# Patient Record
Sex: Male | Born: 1990 | Race: White | Hispanic: No | Marital: Single | Smoking: Former smoker
Health system: Southern US, Community
[De-identification: ages and names within clinical notes are randomized; demographics above are authoritative.]

## PROBLEM LIST (undated history)

## (undated) DIAGNOSIS — J45909 Unspecified asthma, uncomplicated: Secondary | ICD-10-CM

## (undated) DIAGNOSIS — R002 Palpitations: Secondary | ICD-10-CM

## (undated) DIAGNOSIS — J302 Other seasonal allergic rhinitis: Secondary | ICD-10-CM

---

## 2014-05-30 ENCOUNTER — Emergency Department (HOSPITAL_COMMUNITY)
Admission: EM | Admit: 2014-05-30 | Discharge: 2014-05-30 | Disposition: A | Discharge status: Home / Self Care | Attending: Emergency Medicine | Admitting: Emergency Medicine

## 2014-05-30 ENCOUNTER — Encounter (HOSPITAL_COMMUNITY): Payer: Self-pay | Admitting: Emergency Medicine

## 2014-05-30 ENCOUNTER — Emergency Department (HOSPITAL_COMMUNITY)

## 2014-05-30 DIAGNOSIS — W172XXA Fall into hole, initial encounter: Secondary | ICD-10-CM | POA: Insufficient documentation

## 2014-05-30 DIAGNOSIS — Y9389 Activity, other specified: Secondary | ICD-10-CM | POA: Diagnosis not present

## 2014-05-30 DIAGNOSIS — Y929 Unspecified place or not applicable: Secondary | ICD-10-CM | POA: Diagnosis not present

## 2014-05-30 DIAGNOSIS — J45909 Unspecified asthma, uncomplicated: Secondary | ICD-10-CM | POA: Insufficient documentation

## 2014-05-30 DIAGNOSIS — Z791 Long term (current) use of non-steroidal anti-inflammatories (NSAID): Secondary | ICD-10-CM | POA: Insufficient documentation

## 2014-05-30 DIAGNOSIS — Z87891 Personal history of nicotine dependence: Secondary | ICD-10-CM | POA: Insufficient documentation

## 2014-05-30 DIAGNOSIS — W010XXA Fall on same level from slipping, tripping and stumbling without subsequent striking against object, initial encounter: Secondary | ICD-10-CM | POA: Diagnosis not present

## 2014-05-30 DIAGNOSIS — S62339A Displaced fracture of neck of unspecified metacarpal bone, initial encounter for closed fracture: Secondary | ICD-10-CM | POA: Diagnosis not present

## 2014-05-30 DIAGNOSIS — S6990XA Unspecified injury of unspecified wrist, hand and finger(s), initial encounter: Secondary | ICD-10-CM | POA: Diagnosis present

## 2014-05-30 HISTORY — DX: Unspecified asthma, uncomplicated: J45.909

## 2014-05-30 HISTORY — DX: Palpitations: R00.2

## 2014-05-30 HISTORY — DX: Other seasonal allergic rhinitis: J30.2

## 2014-05-30 MED ORDER — IBUPROFEN 800 MG PO TABS
800.0000 mg | ORAL_TABLET | Freq: Once | ORAL | Status: AC
  Administered 2014-05-30: 800 mg via ORAL
  Filled 2014-05-30: qty 1

## 2014-05-30 MED ORDER — IBUPROFEN 800 MG PO TABS: 800.0000 mg | ORAL_TABLET | Freq: Three times a day (TID) | ORAL | Status: AC

## 2014-05-30 NOTE — Discharge Instructions (Signed)
Boxer's Fracture °You have a break (fracture) of the fifth metacarpal bone. This is commonly called a boxer's fracture. This is the bone in the hand where the little finger attaches. The fracture is in the end of that bone, closest to the little finger. It is usually caused when you hit an object with a clenched fist. Often, the knuckle is pushed down by the impact. Sometimes, the fracture rotates out of position. A boxer's fracture will usually heal within 6 weeks, if it is treated properly and protected from re-injury. Surgery is sometimes needed. °A cast, splint, or bulky hand dressing may be used to protect and immobilize a boxer's fracture. Do not remove this device or dressing until your caregiver approves. Keep your hand elevated, and apply ice packs for 15-20 minutes every 2 hours, for the first 2 days. Elevation and ice help reduce swelling and relieve pain. See your caregiver, or an orthopedic specialist, for follow-up care within the next 10 days. This is to make sure your fracture is healing properly. °Document Released: 10/22/2005 Document Revised: 01/14/2012 Document Reviewed: 04/11/2007 °Exit[employer]® Patient Information ©2015 [employer]. This information is not intended to replace advice given to you by your health care provider. Make sure you discuss any questions you have with your health care provider. ° °

## 2014-05-30 NOTE — ED Notes (Signed)
Misstepped into hole and fell on fisted R hand.  Pain in last two knuckles and lateral hand.

## 2014-06-02 NOTE — ED Provider Notes (Signed)
CSN: [id number]     Arrival date & time 05/30/14  1946 History   First MD Initiated Contact with Patient 05/30/14 2040     Chief Complaint  Patient presents with  . Hand Injury   Nathan Krueger is a 23 y.o. male who is an inmate at local correctional facility, presents to the Emergency Department complaining of pain and swelling to his right hand.    (Consider location/radiation/quality/duration/timing/severity/associated sxs/prior Treatment) Patient is a 23 y.o. male presenting with hand injury. The history is provided by the patient.  Hand Injury Location:  Hand Injury: yes   Mechanism of injury: fall   Fall:    Fall occurred: tripped , fell onto his fisted hand.   Point of impact:  Hands   Entrapped after fall: no   Hand location:  R hand Pain details:    Quality:  Throbbing   Radiates to:  Does not radiate   Severity:  Moderate   Onset quality:  Sudden   Timing:  Constant   Progression:  Unchanged Chronicity:  New Handedness:  Right-handed Dislocation: no   Prior injury to area:  No Relieved by:  Nothing Worsened by:  Movement Ineffective treatments:  None tried Associated symptoms: swelling   Associated symptoms: no back pain, no decreased range of motion, no fever, no neck pain, no numbness and no stiffness     Past Medical History  Diagnosis Date  . Seasonal allergies   . Asthma   . Palpitation    History reviewed. No pertinent past surgical history. History reviewed. No pertinent family history. History  Substance Use Topics  . Smoking status: Former Games developermoker  . Smokeless tobacco: Not on file  . Alcohol Use: No    Review of Systems  Constitutional: Negative for fever and chills.  Genitourinary: Negative for dysuria and difficulty urinating.  Musculoskeletal: Positive for arthralgias and joint swelling. Negative for back pain, neck pain and stiffness.  Skin: Negative for color change and wound.  All other systems reviewed and are  negative.     Allergies  Review of patient's allergies indicates no known allergies.  Home Medications   Prior to Admission medications   Medication Sig Start Date End Date Taking? Authorizing Provider  ibuprofen (ADVIL,MOTRIN) 800 MG tablet Take 1 tablet (800 mg total) by mouth 3 (three) times daily. 05/30/14   Rein Popov L. Analaura Messler, PA-C   BP 135/66  Pulse 62  Temp(Src) 98.9 F (37.2 C) (Oral)  Resp 16  Ht 5\' 9"  (1.753 m)  Wt 167 lb (75.751 kg)  BMI 24.65 kg/m2  SpO2 100% Physical Exam  Nursing note and vitals reviewed. Constitutional: He is oriented to person, place, and time. He appears well-developed and well-nourished. No distress.  HENT:  Head: Normocephalic and atraumatic.  Cardiovascular: Normal rate, regular rhythm and normal heart sounds.   No murmur heard. Pulmonary/Chest: Effort normal and breath sounds normal. No respiratory distress.  Musculoskeletal: He exhibits edema and tenderness.  ttp and edema of the dorsal right hand.  Radial pulse is brisk, distal sensation intact.  CR< 2 sec.  No bruising or bony deformity.  Pain with movement of the right fifth finger  Neurological: He is alert and oriented to person, place, and time. He exhibits normal muscle tone. Coordination normal.  Skin: Skin is warm and dry.    ED Course  Procedures (including critical care time) Labs Review Labs Reviewed - No data to display  Imaging Review Dg Hand Complete Right  05/30/2014   CLINICAL  DATA:  Injured right hand.  EXAM: RIGHT HAND - COMPLETE 3+ VIEW  COMPARISON:  None.  FINDINGS: There is a boxer's type fracture involving the fifth metacarpal neck with volar angulation. The joint spaces are maintained. No other fractures are seen.  IMPRESSION: Fifth metacarpal neck fracture.   Electronically Signed   By: Loralie Champagne M.D.   On: 05/30/2014 20:51    EKG Interpretation None      MDM   Final diagnoses:  Boxer's metacarpal fracture, neck, closed, initial encounter     Ulnar gutter splint applied, pain improved, remains NV intact.  Pt to f/u with orthopedics affilated with the correctional facility .      Yarlin Breisch L. Trisha Mangle, PA-C 06/02/14 1548

## 2014-06-05 ENCOUNTER — Other Ambulatory Visit: Payer: Self-pay

## 2014-06-05 ENCOUNTER — Encounter (HOSPITAL_COMMUNITY): Payer: Self-pay | Admitting: Emergency Medicine

## 2014-06-05 ENCOUNTER — Emergency Department (HOSPITAL_COMMUNITY)

## 2014-06-05 ENCOUNTER — Emergency Department (HOSPITAL_COMMUNITY)
Admission: EM | Admit: 2014-06-05 | Discharge: 2014-06-05 | Disposition: A | Discharge status: Home / Self Care | Attending: Emergency Medicine | Admitting: Emergency Medicine

## 2014-06-05 DIAGNOSIS — R079 Chest pain, unspecified: Secondary | ICD-10-CM | POA: Insufficient documentation

## 2014-06-05 DIAGNOSIS — R110 Nausea: Secondary | ICD-10-CM | POA: Insufficient documentation

## 2014-06-05 DIAGNOSIS — Z791 Long term (current) use of non-steroidal anti-inflammatories (NSAID): Secondary | ICD-10-CM | POA: Insufficient documentation

## 2014-06-05 DIAGNOSIS — Z87891 Personal history of nicotine dependence: Secondary | ICD-10-CM | POA: Insufficient documentation

## 2014-06-05 DIAGNOSIS — J45909 Unspecified asthma, uncomplicated: Secondary | ICD-10-CM | POA: Insufficient documentation

## 2014-06-05 LAB — D-DIMER, QUANTITATIVE: D-Dimer, Quant: <0.27 ug/mL-FEU (ref 0.00–0.48)

## 2014-06-05 LAB — TROPONIN I: Troponin I: <0.3 ng/mL (ref <0.30)

## 2014-06-05 NOTE — ED Provider Notes (Signed)
CSN: [id number]     Arrival date & time 06/05/14  1955 History  This chart was scribed for Hilario Quarry, MD by Leona Carry, ED Scribe. The patient was seen in [facility]. The patient's care was started at 8:14 PM.     Chief Complaint  Patient presents with  . Chest Pain    intermittent x 6 months denies and SHOB with it    Patient is a 23 y.o. male presenting with chest pain. The history is provided by the patient. No language interpreter was used.  Chest Pain Associated symptoms: nausea   Associated symptoms: no cough and no fever    HPI Comments: Nathan Krueger is a 23 y.o. male with a history of asthma who presents to the Emergency Department complaining of sharp CP. He reports that the pain began while lying on his bed. He also states that it felt as though his heart skipped a beat. He states that the symptoms are worse when he lies flat. Patient reports associated nausea.  He denies fever, chills, cough. Patient reports that his mother has a history of palpitations.   Patient is currently in prison in the 10th month of a 17 month sentence. He does not have a PCP.  Past Medical History  Diagnosis Date  . Seasonal allergies   . Asthma   . Palpitation    History reviewed. No pertinent past surgical history. No family history on file. History  Substance Use Topics  . Smoking status: Former Games developer  . Smokeless tobacco: Not on file  . Alcohol Use: No    Review of Systems  Constitutional: Negative for fever and chills.  Respiratory: Negative for cough.   Cardiovascular: Positive for chest pain.  Gastrointestinal: Positive for nausea.  All other systems reviewed and are negative.     Allergies  Review of patient's allergies indicates no known allergies.  Home Medications   Prior to Admission medications   Medication Sig Start Date End Date Taking? Authorizing Provider  ibuprofen (ADVIL,MOTRIN) 800 MG tablet Take 1 tablet (800 mg total) by mouth 3 (three) times  daily. 05/30/14   Tammy L. Triplett, PA-C   Triage Vitals: BP 133/81  Pulse 85  Temp(Src) 98.8 F (37.1 C) (Oral)  Resp 18  Ht 5\' 9"  (1.753 m)  Wt 167 lb (75.751 kg)  BMI 24.65 kg/m2  SpO2 100% Physical Exam  Nursing note and vitals reviewed. Constitutional: He is oriented to person, place, and time. He appears well-developed and well-nourished.  HENT:  Head: Normocephalic and atraumatic.  Right Ear: External ear normal.  Left Ear: External ear normal.  Nose: Nose normal.  Mouth/Throat: Oropharynx is clear and moist.  Eyes: Conjunctivae and EOM are normal. Pupils are equal, round, and reactive to light.  Neck: Normal range of motion. Neck supple.  Cardiovascular: Normal rate, regular rhythm, normal heart sounds and intact distal pulses.   Pulmonary/Chest: Effort normal and breath sounds normal. No respiratory distress. He has no wheezes. He exhibits no tenderness.  Abdominal: Soft. Bowel sounds are normal. He exhibits no distension and no mass. There is no tenderness. There is no guarding.  Musculoskeletal: Normal range of motion.  Neurological: He is alert and oriented to person, place, and time. He has normal reflexes. He exhibits normal muscle tone. Coordination normal.  Skin: Skin is warm and dry.  Psychiatric: He has a normal mood and affect. His behavior is normal. Judgment and thought content normal.    ED Course  Procedures (including critical  care time) DIAGNOSTIC STUDIES: Oxygen Saturation is 100% on room air, normal by my interpretation.    COORDINATION OF CARE: 8:19 PM-Discussed treatment plan which includes EKG, CXR, and labs with pt at bedside and pt agreed to plan.     Labs Review Labs Reviewed  D-DIMER, QUANTITATIVE  TROPONIN I    Imaging Review Dg Chest 2 View  06/05/2014   CLINICAL DATA:  Chest pain and shortness of breath  EXAM: CHEST  2 VIEW  COMPARISON:  None.  FINDINGS: The heart size and mediastinal contours are within normal limits. Both lungs are  clear. The visualized skeletal structures are unremarkable.  IMPRESSION: No active cardiopulmonary disease.   Electronically Signed   By: Alcide CleverMark  Lukens M.D.   On: 06/05/2014 21:14     Date: 06/05/2014  Rate: 67  Rhythm: normal sinus rhythm  QRS Axis: normal  Intervals: normal  ST/T Wave abnormalities: normal  Conduction Disutrbances:none  Narrative Interpretation:   Old EKG Reviewed: none available   MDM   Final diagnoses:  Chest pain, unspecified chest pain type   23 y.o. Male with nonspecific chest pain intermittently and some dyspnea with normal ekg, troponin, cxr, and d-dimer.edscribe  I personally performed the services described in this documentation, which was scribed in my presence. The recorded information has been reviewed and considered.    Hilario Quarryanielle S Sanita Estrada, MD 06/06/14 1900

## 2014-06-05 NOTE — ED Notes (Signed)
Report given to Adams,RN at [facility] for pt d/c back to [facility].

## 2014-06-05 NOTE — Discharge Instructions (Signed)

## 2015-09-05 IMAGING — CR DG HAND COMPLETE 3+V*R*
3 series · 3 of 3 positions shown · non-contrast
Comparison: None.

CLINICAL DATA: Injured right hand.

EXAM:
RIGHT HAND - COMPLETE 3+ VIEW

[view not recorded (1 of 3)]
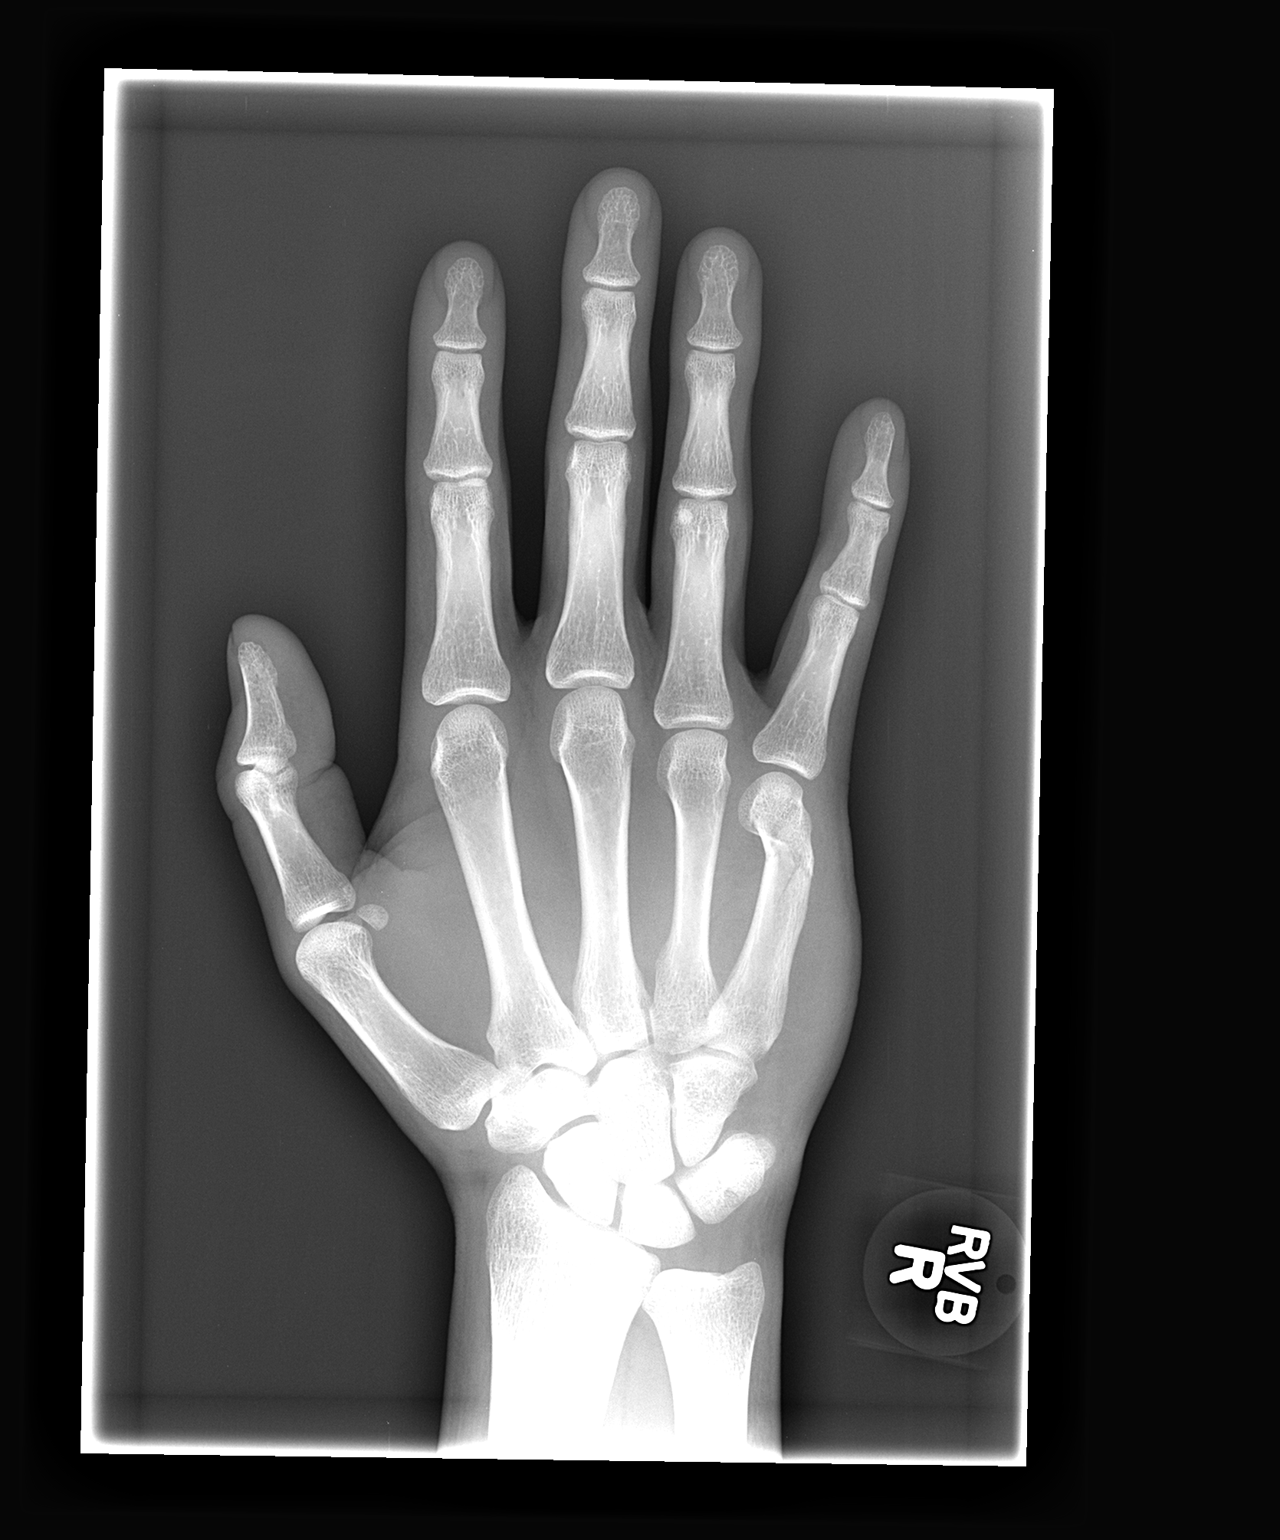

[view not recorded (2 of 3)]
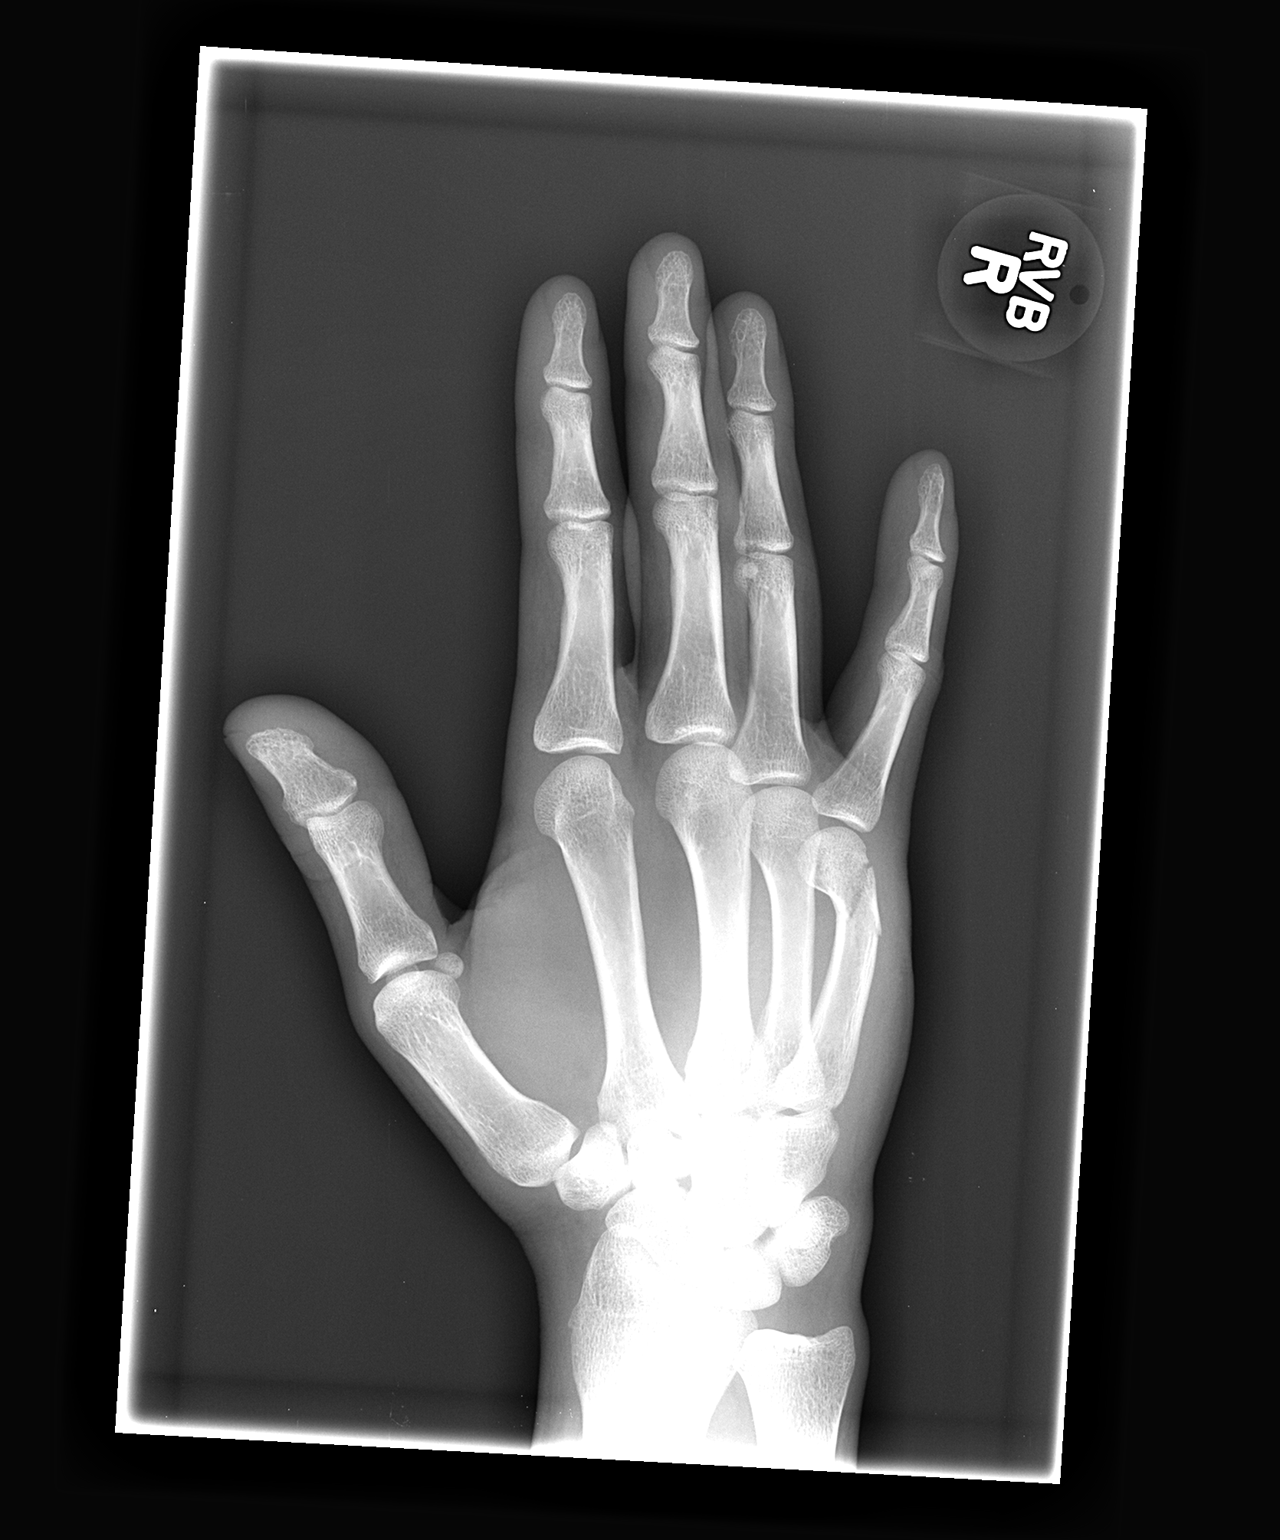

[view not recorded (3 of 3)]
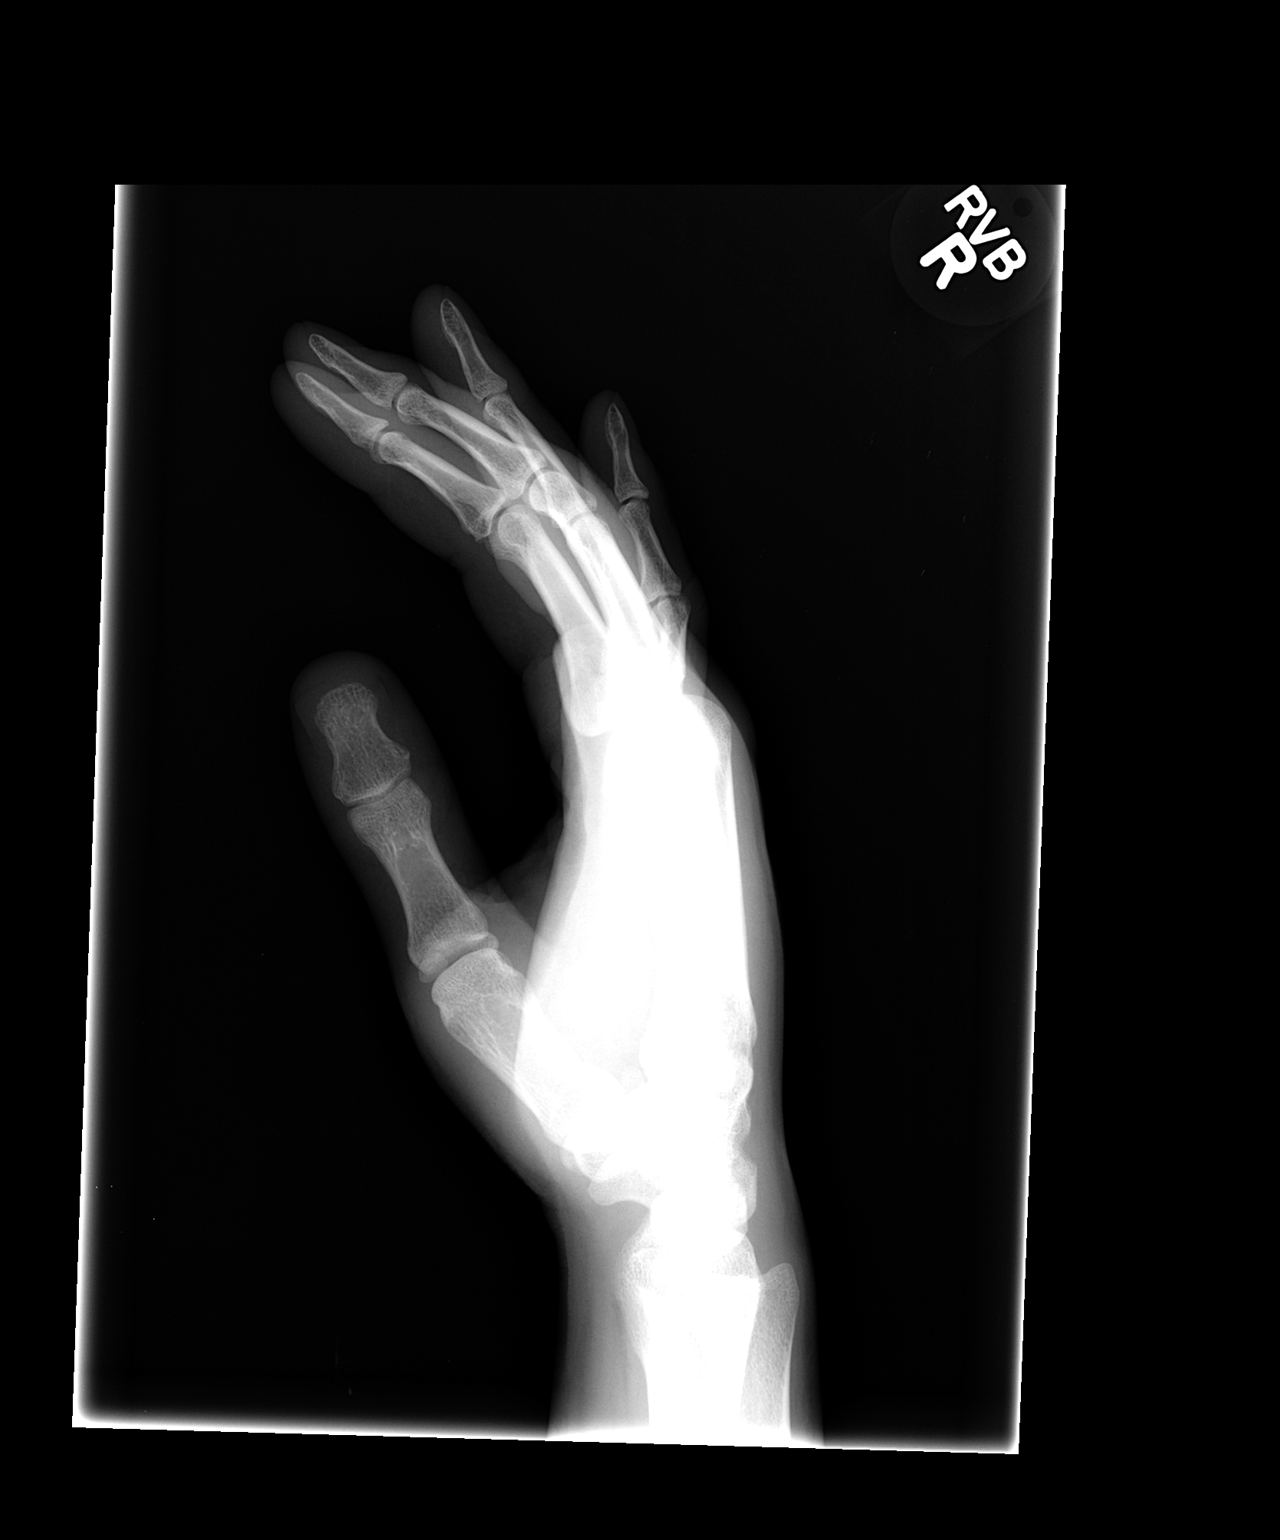

[3 of 3 positions shown; findings below may reference images not displayed]

FINDINGS: There is a boxer's type fracture involving the fifth metacarpal neck
with volar angulation. The joint spaces are maintained. No other
fractures are seen.
IMPRESSION: Fifth metacarpal neck fracture.
